# Patient Record
Sex: Male | Born: 1995 | Race: White | Hispanic: No | Marital: Single | Smoking: Current every day smoker
Health system: Southern US, Community
[De-identification: ages and names within clinical notes are randomized; demographics above are authoritative.]

---
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
## 2014-02-28 ENCOUNTER — Emergency Department: Payer: Self-pay | Admitting: Emergency Medicine

## 2014-03-02 ENCOUNTER — Ambulatory Visit (INDEPENDENT_AMBULATORY_CARE_PROVIDER_SITE_OTHER): Payer: BC Managed Care – PPO

## 2014-03-02 ENCOUNTER — Encounter: Payer: Self-pay | Admitting: Podiatry

## 2014-03-02 ENCOUNTER — Ambulatory Visit (INDEPENDENT_AMBULATORY_CARE_PROVIDER_SITE_OTHER): Payer: BC Managed Care – PPO | Admitting: Podiatry

## 2014-03-02 VITALS — Resp 16 | Ht 67.0 in | Wt 170.0 lb

## 2014-03-02 DIAGNOSIS — M79673 Pain in unspecified foot: Secondary | ICD-10-CM

## 2014-03-02 DIAGNOSIS — M79609 Pain in unspecified limb: Secondary | ICD-10-CM

## 2014-03-02 DIAGNOSIS — S82899A Other fracture of unspecified lower leg, initial encounter for closed fracture: Secondary | ICD-10-CM

## 2014-03-02 NOTE — Progress Notes (Signed)
   Subjective:    Patient ID: Billy HaleRalph Sliwinski, male    DOB: December 24, 1995, 18 y.o.   MRN: [id number]  HPI Comments: i fractured my ankle on Tuesday. Its gotten better since then. i was playing soccer when the accident happened. i went to the ER and they took x-rays, put a splint on me and gave me ibuprofen and gave me a Rx for percocet but i didn't get it filled. i have been staying off of it and elevating and using crutches.   Foot Pain      Review of Systems  All other systems reviewed and are negative.       Objective:   Physical Exam: I have reviewed his past history medications allergies surgeries social history. Pulses are palpable right foot. Mild edema about the right foot. Neurologic sensorium is intact. Deep tendon reflexes are intact. He has a good range of motion of the ankle. Pain on palpation medial malleolus. Muscle strength is normal. Radiographic evaluation demonstrates a medial malleolar fracture nondisplaced non-comminuted.        Assessment & Plan:  Assessment: Fracture medial malleolus non-comminuted nondisplaced.  Plan: Application of a below-knee fiberglass cast and routine fashion he will remain nonweightbearing I will followup with him in 4 weeks for another set of x-rays.

## 2014-03-07 DIAGNOSIS — M79609 Pain in unspecified limb: Secondary | ICD-10-CM

## 2014-03-29 ENCOUNTER — Ambulatory Visit (INDEPENDENT_AMBULATORY_CARE_PROVIDER_SITE_OTHER): Payer: BC Managed Care – PPO

## 2014-03-29 ENCOUNTER — Ambulatory Visit (INDEPENDENT_AMBULATORY_CARE_PROVIDER_SITE_OTHER): Payer: BC Managed Care – PPO | Admitting: Podiatry

## 2014-03-29 VITALS — BP 110/68 | HR 86 | Resp 16

## 2014-03-29 DIAGNOSIS — S82899A Other fracture of unspecified lower leg, initial encounter for closed fracture: Secondary | ICD-10-CM

## 2014-03-29 DIAGNOSIS — S8253XA Displaced fracture of medial malleolus of unspecified tibia, initial encounter for closed fracture: Secondary | ICD-10-CM

## 2014-03-29 NOTE — Progress Notes (Signed)
He presents today for week status post fractured medial malleolus right ankle. He presents with a dirty bottom to his cast for his been walking on it. He states it does not hurt any longer.  Objective: Vital signs are stable he is alert and oriented x3. Cast was removed demonstrates no erythema edema saline is drainage or odor no pain on palpation of the medial malleolus. Mild tenderness on inversion and eversion of the ankle joint. Radiographic evaluation does demonstrate that it has not healed as of yet.  Assessment: Fractured medial malleolus non-complicated nondisplaced non-comminuted.  Plan: Application of a new fiberglass cast below-knee right. Followup with Dr. Donzetta KohutEdgerton in 4 weeks for removal and application of the boot if necessary. Radiographs will be performed at that time.

## 2014-03-30 ENCOUNTER — Ambulatory Visit: Payer: BC Managed Care – PPO | Admitting: Podiatry

## 2014-04-27 ENCOUNTER — Ambulatory Visit (INDEPENDENT_AMBULATORY_CARE_PROVIDER_SITE_OTHER): Payer: BC Managed Care – PPO

## 2014-04-27 ENCOUNTER — Ambulatory Visit (INDEPENDENT_AMBULATORY_CARE_PROVIDER_SITE_OTHER): Payer: BC Managed Care – PPO | Admitting: Podiatrist

## 2014-04-27 VITALS — BP 134/87 | HR 83 | Resp 16

## 2014-04-27 DIAGNOSIS — S82899A Other fracture of unspecified lower leg, initial encounter for closed fracture: Secondary | ICD-10-CM

## 2014-04-27 DIAGNOSIS — S8253XA Displaced fracture of medial malleolus of unspecified tibia, initial encounter for closed fracture: Secondary | ICD-10-CM

## 2014-04-27 NOTE — Patient Instructions (Signed)
Do not walk on your boot!!  Keep your foot up and use your crutches as if you still have a cast on your foot.

## 2014-04-27 NOTE — Progress Notes (Signed)
Subjective: Billy Wagner presents today for a re check status post fractured medial malleolus right ankle. He presents with an intact cast however it does appear that he has done some walking on the bottom of the cast. He states it does not hurt any longer and he is ready to get out of the cast. He also states he would like to drive now. He presents today with his mother.   Objective: Vital signs are stable he is alert and oriented x3. Cast was removed demonstrates no edema, no ecchymosis, no pain on palpation of the medial malleolus. No tenderness on inversion and eversion of the ankle joint. Radiographic evaluation does demonstrate that the medial malleolus fracture is not healed as of yet.   Assessment: Fractured medial malleolus non-complicated nondisplaced non-comminuted.   Plan: Applied a long Cam Walker and gave very specific instructions that he is to treat it like a cast and he is not to put weight on the foot. He may remove it for showering and sleeping however he is not to drive in the boot. He is also to continue using his crutches. He will be seen back for recheck by Dr. Al Corpus for further evaluation and recommendation.

## 2014-05-05 ENCOUNTER — Telehealth: Payer: Self-pay | Admitting: *Deleted

## 2014-05-05 NOTE — Telephone Encounter (Signed)
Patients mother Steward Drone called asking [place] to return her call she has a question. Called her back and left message for Steward Drone to return our call

## 2014-05-18 ENCOUNTER — Ambulatory Visit (INDEPENDENT_AMBULATORY_CARE_PROVIDER_SITE_OTHER): Payer: BC Managed Care – PPO | Admitting: Podiatry

## 2014-05-18 ENCOUNTER — Ambulatory Visit (INDEPENDENT_AMBULATORY_CARE_PROVIDER_SITE_OTHER): Payer: BC Managed Care – PPO

## 2014-05-18 ENCOUNTER — Encounter: Payer: Self-pay | Admitting: Podiatry

## 2014-05-18 DIAGNOSIS — S8253XA Displaced fracture of medial malleolus of unspecified tibia, initial encounter for closed fracture: Secondary | ICD-10-CM

## 2014-05-18 NOTE — Progress Notes (Signed)
He presents today for followup of his fractured medial and lateral malleolar closed. He states is doing much better and his mother relates that he is been walking without his crutches or without his boot on at home without pain.  Objective: Vital signs are stable he is alert oriented x3 there is no erythema edema saline is drainage or odor to the right ankle. Pulses are palpable. No pain on palpation of the medial or lateral malleolus radiographic evaluation demonstrates well-healing fracture that it has not completely healed as of yet.  Assessment: Well-healing fracture ankle right.  Plan: Transition from a Cam Walker to a tri-lock brace right. Followup with

## 2014-05-29 ENCOUNTER — Ambulatory Visit: Payer: BC Managed Care – PPO | Admitting: Podiatry

## 2015-09-19 ENCOUNTER — Ambulatory Visit (INDEPENDENT_AMBULATORY_CARE_PROVIDER_SITE_OTHER): Payer: BLUE CROSS/BLUE SHIELD | Admitting: Podiatry

## 2015-09-19 ENCOUNTER — Encounter: Payer: Self-pay | Admitting: Podiatry

## 2015-09-19 VITALS — BP 121/95 | HR 67 | Resp 18

## 2015-09-19 DIAGNOSIS — L600 Ingrowing nail: Secondary | ICD-10-CM

## 2015-09-19 MED ORDER — AMOXICILLIN-POT CLAVULANATE 875-125 MG PO TABS: 1.0000 | ORAL_TABLET | Freq: Two times a day (BID) | ORAL | Status: DC

## 2015-09-19 MED ORDER — NEOMYCIN-POLYMYXIN-HC 3.5-10000-1 OT SOLN
OTIC | Status: DC
Start: 2015-09-19 — End: 2015-11-06

## 2015-09-19 NOTE — Progress Notes (Signed)
He presents today for a chief complaint of a painful ingrown toenail hallux left. He states this been there since July of this past number. He states that he's had 3 rounds of antibiotics and it hasn't helped yet. He currently works for [employer] that he states that this is painful to perform his job.  Objective: Vital signs are stable he is alert and oriented times 3I reviewed his past medical history medications and allergies. Sharp incurvated nail margins with erythema and edema along the tibial and fibular border of the hallux left. Exquisitely painful on palpation cellulitis does not extend past the level of the IP joint.  Assessment: Ingrown nail paronychia abscess hallux left.  Plan: Chemical matrixectomy was performed today along the tibial fibular border after local anesthesia was achieved. We also wrote another prescription for Cortisporin Otic and Augmentin. I will follow-up with him in 1 week to make sure that he is healing well.   Arbutus Pedodd Maxey Ransom DPM

## 2015-09-19 NOTE — Patient Instructions (Signed)

## 2015-09-27 ENCOUNTER — Encounter: Payer: Self-pay | Admitting: Podiatry

## 2015-09-27 ENCOUNTER — Ambulatory Visit (INDEPENDENT_AMBULATORY_CARE_PROVIDER_SITE_OTHER): Payer: BLUE CROSS/BLUE SHIELD | Admitting: Podiatry

## 2015-09-27 VITALS — BP 123/69 | HR 75 | Resp 12

## 2015-09-27 DIAGNOSIS — L600 Ingrowing nail: Secondary | ICD-10-CM

## 2015-09-27 DIAGNOSIS — Z9889 Other specified postprocedural states: Secondary | ICD-10-CM

## 2015-09-27 NOTE — Patient Instructions (Signed)
Continue soaking in epsom salts twice a day followed by antibiotic ointment and a band-aid. Can leave uncovered at night. Continue this until completely healed.  Monitor for any signs/symptoms of infection. Call the office immediately if any occur or go directly to the emergency room. Call with any questions/concerns.  

## 2015-09-27 NOTE — Progress Notes (Signed)
Patient ID: Billy Wagner, male   DOB: 06/27/1996, 19 y.o.   MRN: [id number]  Subjective: 19 year old male presents the office they for 1 week followup evaluation status post left partial nail avulsion with chemical matricectomy. He states that he gets and cleared of bloody drainage but denies any pus. He's been continuing the antibiotics. He denies any significant increase in redness or red streaking. He's been soaking in Epson salt soaks covering with Cortisporin and a Band-Aid. He denies any systemic complaints as fevers, chills, nausea, vomiting. There is no calf pain, chest pain, shortness of breath. No other complaints at this time in no acute changes since last appointment.  Objective: AAO X3, NAD DP/PT pulses 2/4, CRT less than 3 seconds, pedal hair present. Protective sensation intact with Dorann OuSimms Weinstein monofilament Status post left medial and lateral hallux partial nail avulsion. There is granulation tissue around the procedure site as well a small scab in the procedure sites. There is a small amount of serous drainage there is no purulence. There is a small amount of localized erythema directly around procedure site without any ascending celllulitis. There is no tenderness to palpation over the procedure sites. There is no areas of softness or crepitus. No malodor. No other areas of tenderness bilateral lower extremities.   Assessment: 19 year old male one week status post left partial nail avulsion due to paronychia, healing  Plan: -Treatment options discussed including all alternatives, risks, and complications -Recommended to continue soaking in Epson salt soaks twice a day covering with either Neosporin with Cortisporin drops and a Band-Aid. Can leave the area uncovered at night. -Finish course of antibiotics. -Monitor for any clinical signs or symptoms of worsening infection and directed to call the office immediately should any occur or go to the ER. -Follow-up in 2 weeks with  Dr. Al CorpusHyatt or sooner if any problems arise. In the meantime, encouraged to call the office with any questions, concerns, change in symptoms.   Billy Wagner, DPM

## 2015-10-15 ENCOUNTER — Ambulatory Visit: Payer: BLUE CROSS/BLUE SHIELD | Admitting: Podiatry

## 2015-11-06 ENCOUNTER — Encounter: Payer: Self-pay | Admitting: Podiatry

## 2015-11-06 ENCOUNTER — Ambulatory Visit (INDEPENDENT_AMBULATORY_CARE_PROVIDER_SITE_OTHER): Payer: BLUE CROSS/BLUE SHIELD | Admitting: Podiatry

## 2015-11-06 DIAGNOSIS — Z9889 Other specified postprocedural states: Secondary | ICD-10-CM

## 2015-11-06 DIAGNOSIS — L600 Ingrowing nail: Secondary | ICD-10-CM

## 2015-11-06 NOTE — Progress Notes (Signed)
He presents today for follow-up of a nail check tibial and fibular border hallux left. He denies fever chills nausea vomiting muscle aches and pains states that the toe feels so much better.  Objective: Vital signs stable alert and oriented 3. No erythema edema saline as drainage or odor. His margins appear to be healing 100%.  Assessment: Well-healing surgical toe hallux left.  Plan: Follow up with me with any changes or questions.

## 2015-11-25 IMAGING — CR RIGHT ANKLE - COMPLETE 3+ VIEW
1 series · 3 of 3 positions shown · non-contrast
Comparison: None.

CLINICAL DATA: Twisted ankle during soccer.

EXAM:
RIGHT ANKLE - COMPLETE 3+ VIEW

[Series 1: x ankle ap right · 0.14mm/px · 3 of 3 slices shown]
[im 1/3]
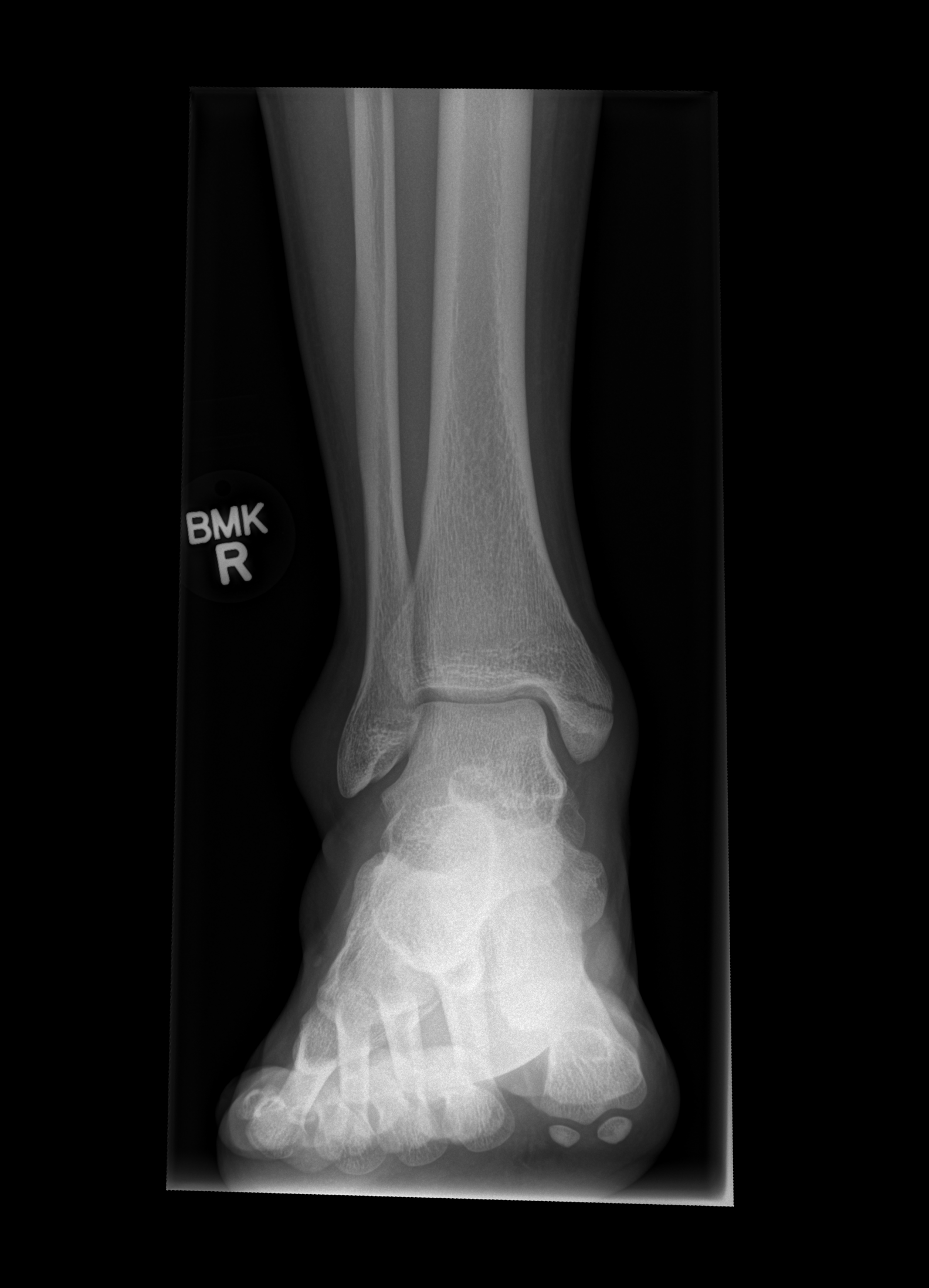
[im 2/3]
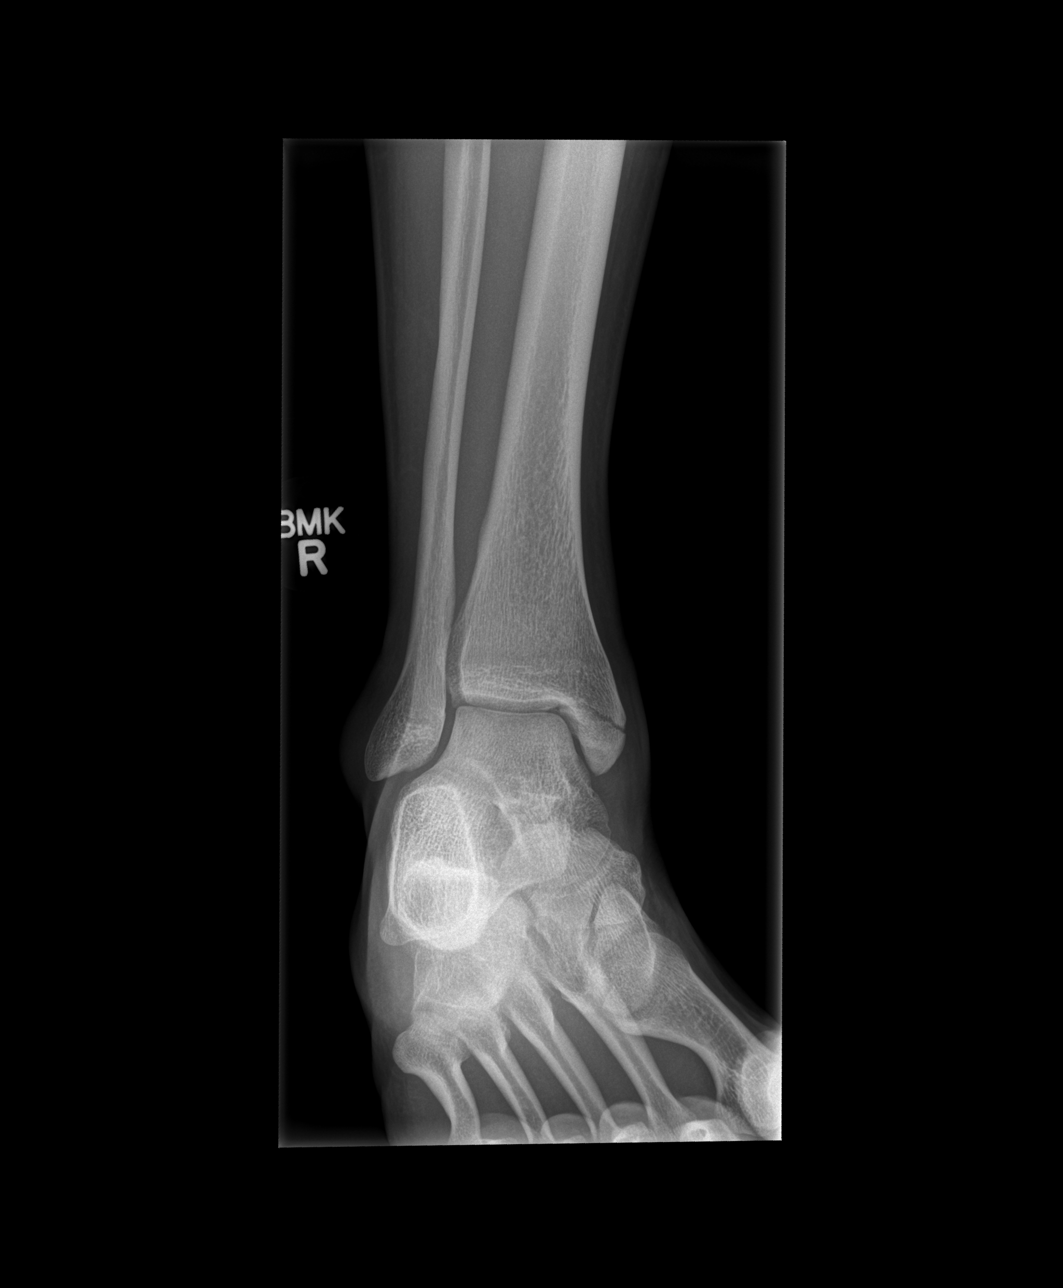
[im 3/3]
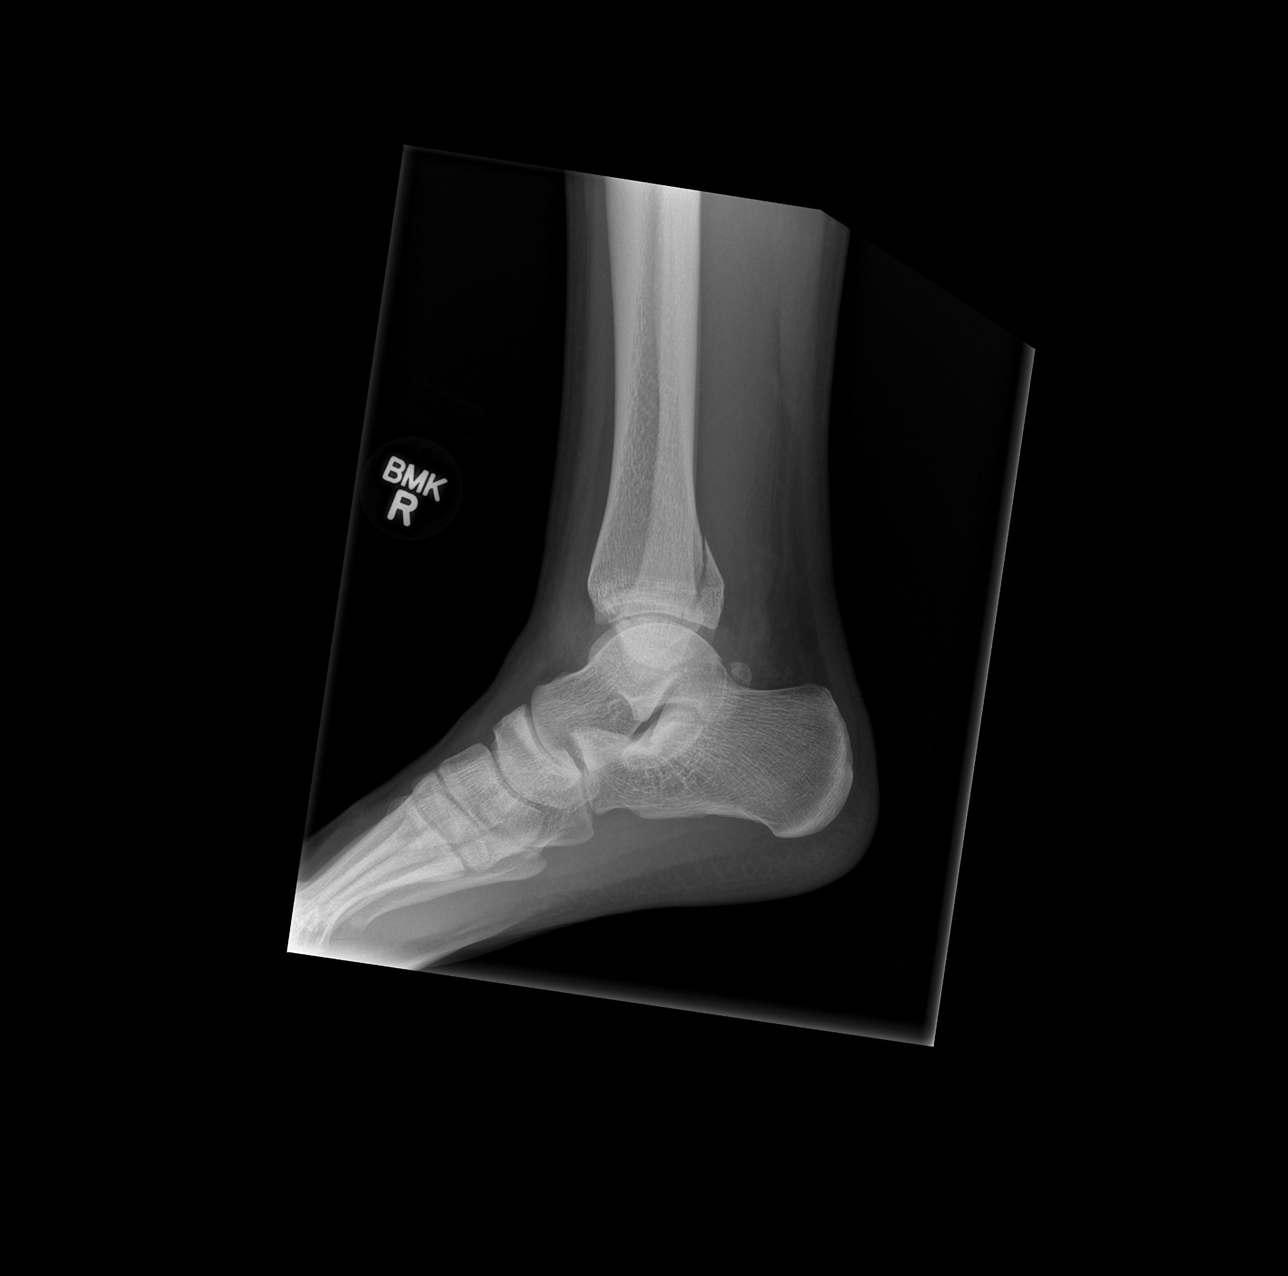

[3 of 3 positions shown; findings below may reference images not displayed]

FINDINGS: There is a transverse, nondisplaced fracture through the medial
malleolus. Ankle mortise is congruent. A posterior malleolus
fracture is also noted, extending to the tibiotalar joint, without
offset. No fibular fractures seen, consider imaging of the proximal
leg.
IMPRESSION: 1. Nondisplaced medial malleolus fracture.
2. Posterior malleolus fracture without displacement along the
articular surface.
3. No visible fibula fracture.  Consider proximal fibula imaging.

## 2021-09-10 ENCOUNTER — Ambulatory Visit
Admission: EM | Admit: 2021-09-10 | Discharge: 2021-09-10 | Disposition: A | Discharge status: Home / Self Care | Payer: Worker's Compensation | Attending: Emergency Medicine | Admitting: Emergency Medicine

## 2021-09-10 ENCOUNTER — Other Ambulatory Visit: Payer: Self-pay

## 2021-09-10 ENCOUNTER — Encounter: Payer: Self-pay | Admitting: Emergency Medicine

## 2021-09-10 DIAGNOSIS — T2010XA Burn of first degree of head, face, and neck, unspecified site, initial encounter: Secondary | ICD-10-CM

## 2021-09-10 MED ORDER — IBUPROFEN 600 MG PO TABS: 600.0000 mg | ORAL_TABLET | Freq: Four times a day (QID) | ORAL | 0 refills | Status: AC | PRN

## 2021-09-10 NOTE — Discharge Instructions (Addendum)
Use real aloe vera for your face.  May take 600 mg ibuprofen combined with 1000 mg of Tylenol together 3-4 times a day as needed for pain.  Ice to the sore areas.  Follow-up with employee wellness at [facility] as needed.
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
